# Patient Record
Sex: Male | Born: 1990 | Race: White | Hispanic: No | Marital: Single | Smoking: Never smoker
Health system: Southern US, Community
[De-identification: ages and names within clinical notes are randomized; demographics above are authoritative.]

---

## 2013-02-02 ENCOUNTER — Emergency Department (INDEPENDENT_AMBULATORY_CARE_PROVIDER_SITE_OTHER): Payer: BC Managed Care – PPO

## 2013-02-02 ENCOUNTER — Emergency Department (HOSPITAL_COMMUNITY)
Admission: EM | Admit: 2013-02-02 | Discharge: 2013-02-02 | Disposition: A | Discharge status: Home / Self Care | Payer: BC Managed Care – PPO | Source: Home / Self Care | Attending: Family Medicine | Admitting: Family Medicine

## 2013-02-02 ENCOUNTER — Encounter (HOSPITAL_COMMUNITY): Payer: Self-pay | Admitting: Emergency Medicine

## 2013-02-02 ENCOUNTER — Other Ambulatory Visit (HOSPITAL_COMMUNITY): Payer: BC Managed Care – PPO

## 2013-02-02 DIAGNOSIS — S82899A Other fracture of unspecified lower leg, initial encounter for closed fracture: Secondary | ICD-10-CM

## 2013-02-02 DIAGNOSIS — S82892A Other fracture of left lower leg, initial encounter for closed fracture: Secondary | ICD-10-CM

## 2013-02-02 DIAGNOSIS — Y9343 Activity, gymnastics: Secondary | ICD-10-CM

## 2013-02-02 MED ORDER — HYDROCODONE-ACETAMINOPHEN 5-325 MG PO TABS: 1.0000 | ORAL_TABLET | Freq: Four times a day (QID) | ORAL | Status: AC | PRN

## 2013-02-02 NOTE — Discharge Instructions (Signed)
°  Ankle Fracture °A fracture is a break in the bone. A cast or splint is used to protect and keep your injured bone from moving.  °HOME CARE INSTRUCTIONS  °· Use your crutches as directed. °· To lessen the swelling, keep the injured leg elevated while sitting or lying down. °· Apply ice to the injury for 15-20 minutes, 03-04 times per day while awake for 2 days. Put the ice in a plastic bag and place a thin towel between the bag of ice and your cast. °· If you have a plaster or fiberglass cast: °· Do not try to scratch the skin under the cast using sharp or pointed objects. °· Check the skin around the cast every day. You may put lotion on any red or sore areas. °· Keep your cast dry and clean. °· If you have a plaster splint: °· Wear the splint as directed. °· You may loosen the elastic around the splint if your toes become numb, tingle, or turn cold or blue. °· Do not put pressure on any part of your cast or splint; it may break. Rest your cast only on a pillow the first 24 hours until it is fully hardened. °· Your cast or splint can be protected during bathing with a plastic bag. Do not lower the cast or splint into water. °· Take medications as directed by your caregiver. Only take over-the-counter or prescription medicines for pain, discomfort, or fever as directed by your caregiver. °· Do not drive a vehicle until your caregiver specifically tells you it is safe to do so. °· If your caregiver has given you a follow-up appointment, it is very important to keep that appointment. Not keeping the appointment could result in a chronic or permanent injury, pain, and disability. If there is any problem keeping the appointment, you must call back to this facility for assistance. °SEEK IMMEDIATE MEDICAL CARE IF:  °· Your cast gets damaged or breaks. °· You have continued severe pain or more swelling than you did before the cast was put on. °· Your skin or toenails below the injury turn blue or gray, or feel cold or  numb. °· There is a bad smell or new stains and/or purulent (pus like) drainage coming from under the cast. °If you do not have a window in your cast for observing the wound, a discharge or minor bleeding may show up as a stain on the outside of your cast. Report these findings to your caregiver. °MAKE SURE YOU:  °· Understand these instructions. °· Will watch your condition. °· Will get help right away if you are not doing well or get worse. °Document Released: 01/03/2000 Document Revised: 03/30/2011 Document Reviewed: 08/04/2012 °[employer]® Patient Information ©2014 [employer]. ° ° °

## 2013-02-02 NOTE — ED Notes (Signed)
Pt c/o left ankle inj onset 1630 today while a gymnastics... Reports he did a flip and when he landed he hear a "pop" Ankle twisted outward and foot inward... sxs include: swelling, pain Alert w/no signs of acute distress... Came back to exam room in crutches.

## 2013-02-02 NOTE — ED Provider Notes (Signed)
CSN: [id number]     Arrival date & time 02/02/13  1747 History   None    Chief Complaint  Patient presents with  . Ankle Injury   (Consider location/radiation/quality/duration/timing/severity/associated sxs/prior Treatment) HPI Comments: 23 year old male presents complaining of left ankle injury sustained about 2 hours ago while performing gymnastics. He was doing a back flip when he landed on his ankle and it rolled inward. She felt a pop in the ankle and had almost immediate swelling in the lateral left ankle. he is not able to bear weight on it since this happened. No numbness in the foot. No other injury.  Patient is a 23 y.o. male presenting with lower extremity injury.  Ankle Injury Pertinent negatives include no chest pain, no abdominal pain and no shortness of breath.    History reviewed. No pertinent past medical history. History reviewed. No pertinent past surgical history. No family history on file. History  Substance Use Topics  . Smoking status: Never Smoker   . Smokeless tobacco: Not on file  . Alcohol Use: Yes    Review of Systems  Constitutional: Negative for fever, chills and fatigue.  HENT: Negative for sore throat.   Eyes: Negative for visual disturbance.  Respiratory: Negative for cough and shortness of breath.   Cardiovascular: Negative for chest pain, palpitations and leg swelling.  Gastrointestinal: Negative for nausea, vomiting, abdominal pain, diarrhea and constipation.  Genitourinary: Negative for dysuria, urgency, frequency and hematuria.  Musculoskeletal: Positive for arthralgias and gait problem. Negative for myalgias, neck pain and neck stiffness.  Skin: Negative for rash.  Neurological: Negative for dizziness, weakness and light-headedness.    Allergies  Review of patient's allergies indicates no known allergies.  Home Medications   Current Outpatient Rx  Name  Route  Sig  Dispense  Refill  . FINASTERIDE PO   Oral   Take by mouth.          Marland Kitchen HYDROcodone-acetaminophen (NORCO/VICODIN) 5-325 MG per tablet   Oral   Take 1 tablet by mouth every 6 (six) hours as needed for moderate pain.   30 tablet   0    BP 126/71  Pulse 89  Temp(Src) 98.5 F (36.9 C) (Oral)  Resp 16  SpO2 99% Physical Exam  Nursing note and vitals reviewed. Constitutional: He is oriented to person, place, and time. He appears well-developed and well-nourished. No distress.  HENT:  Head: Normocephalic.  Pulmonary/Chest: Effort normal. No respiratory distress.  Musculoskeletal:       Left ankle: He exhibits decreased range of motion and swelling. Tenderness. Lateral malleolus, AITFL and CF ligament tenderness found. Achilles tendon normal.  Neurological: He is alert and oriented to person, place, and time. Coordination normal.  Skin: Skin is warm and dry. No rash noted. He is not diaphoretic.  Psychiatric: He has a normal mood and affect. Judgment normal.    ED Course  Procedures (including critical care time) Labs Review Labs Reviewed - No data to display Imaging Review Dg Ankle Complete Left  02/02/2013   CLINICAL DATA:  Left ankle injury.  EXAM: LEFT ANKLE COMPLETE - 3+ VIEW  COMPARISON:  Concern none available  FINDINGS: Tiny bony fragment inferior to medial malleolus tip without donor site. No dislocation. The ankle mortise appears congruent and the tibiofibular syndesmosis intact. No destructive bony lesions. Lateral ankle soft tissue swelling without subcutaneous gas or radiopaque foreign bodies.  IMPRESSION: Tiny bony fragment at the medial malleolar tip concerning for avulsion injury. Recommend correlation with point tenderness. No  dislocation.   Electronically Signed   By: Awilda Metroourtnay  Bloomer   On: 02/02/2013 18:38      MDM   1. Avulsion fracture of left ankle    Cam Walker boot and crutches. He will followup with orthopedics. This is either simple avulsion fracture or avulsed ligament of the ankle. He may need an MRI to delineate this  further. Nonweightbearing and RICE until he follows up with orthopedics   Meds ordered this encounter  Medications  . HYDROcodone-acetaminophen (NORCO/VICODIN) 5-325 MG per tablet    Sig: Take 1 tablet by mouth every 6 (six) hours as needed for moderate pain.    Dispense:  30 tablet    Refill:  0    Order Specific Question:  Supervising Provider    Answer:  Bradd CanaryKINDL, JAMES D [[id number]]       Graylon GoodZachary H Donavan Kerlin, PA-C 02/02/13 1904

## 2013-02-02 NOTE — ED Provider Notes (Signed)
Medical screening examination/treatment/procedure(s) were performed by resident physician or non-physician practitioner and as supervising physician I was immediately available for consultation/collaboration.   Jessamyn Watterson DOUGLAS MD.   Marybeth Dandy D Anora Schwenke, MD 02/02/13 2056 

## 2015-07-17 IMAGING — CR DG ANKLE COMPLETE 3+V*L*
3 series · 3 of 3 positions shown · non-contrast
Comparison: Concern none available

CLINICAL DATA: Left ankle injury.

EXAM:
LEFT ANKLE COMPLETE - 3+ VIEW

[view not recorded (1 of 3)]
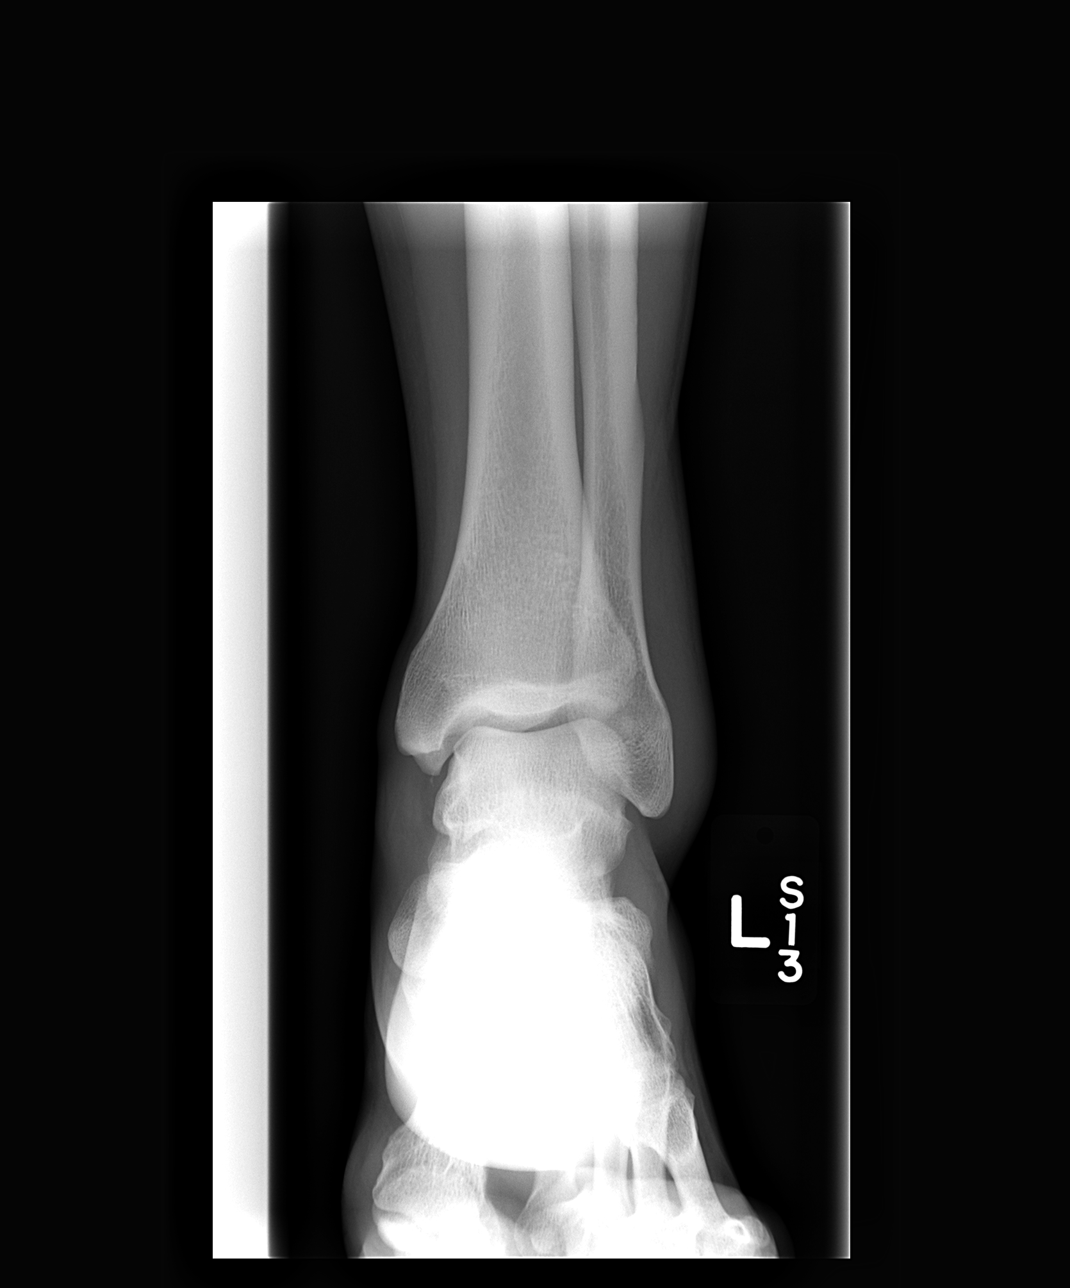

[view not recorded (2 of 3)]
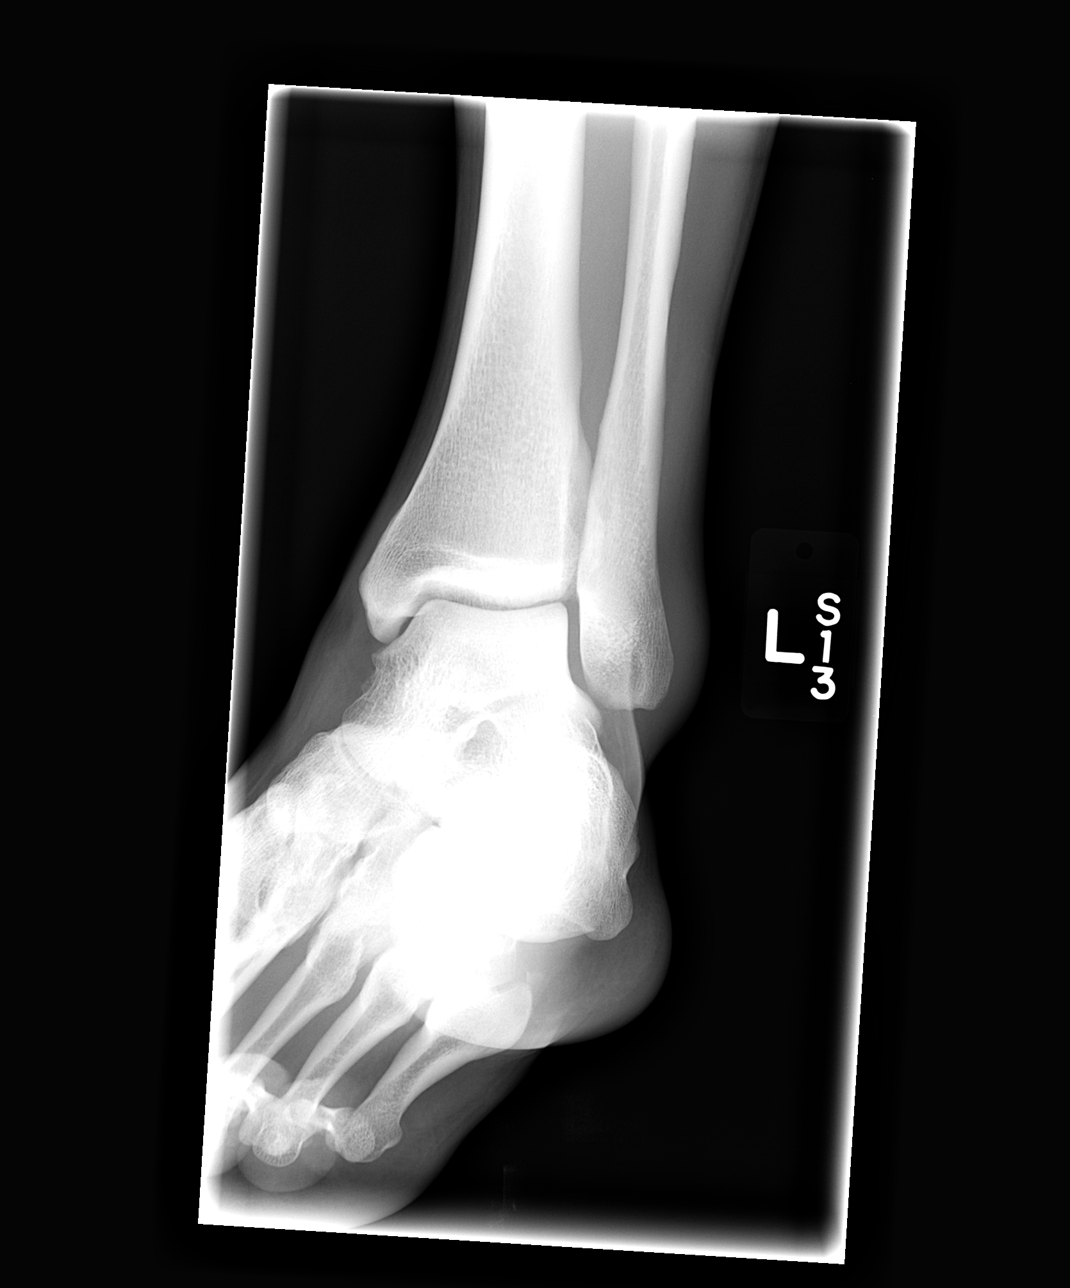

[view not recorded (3 of 3)]
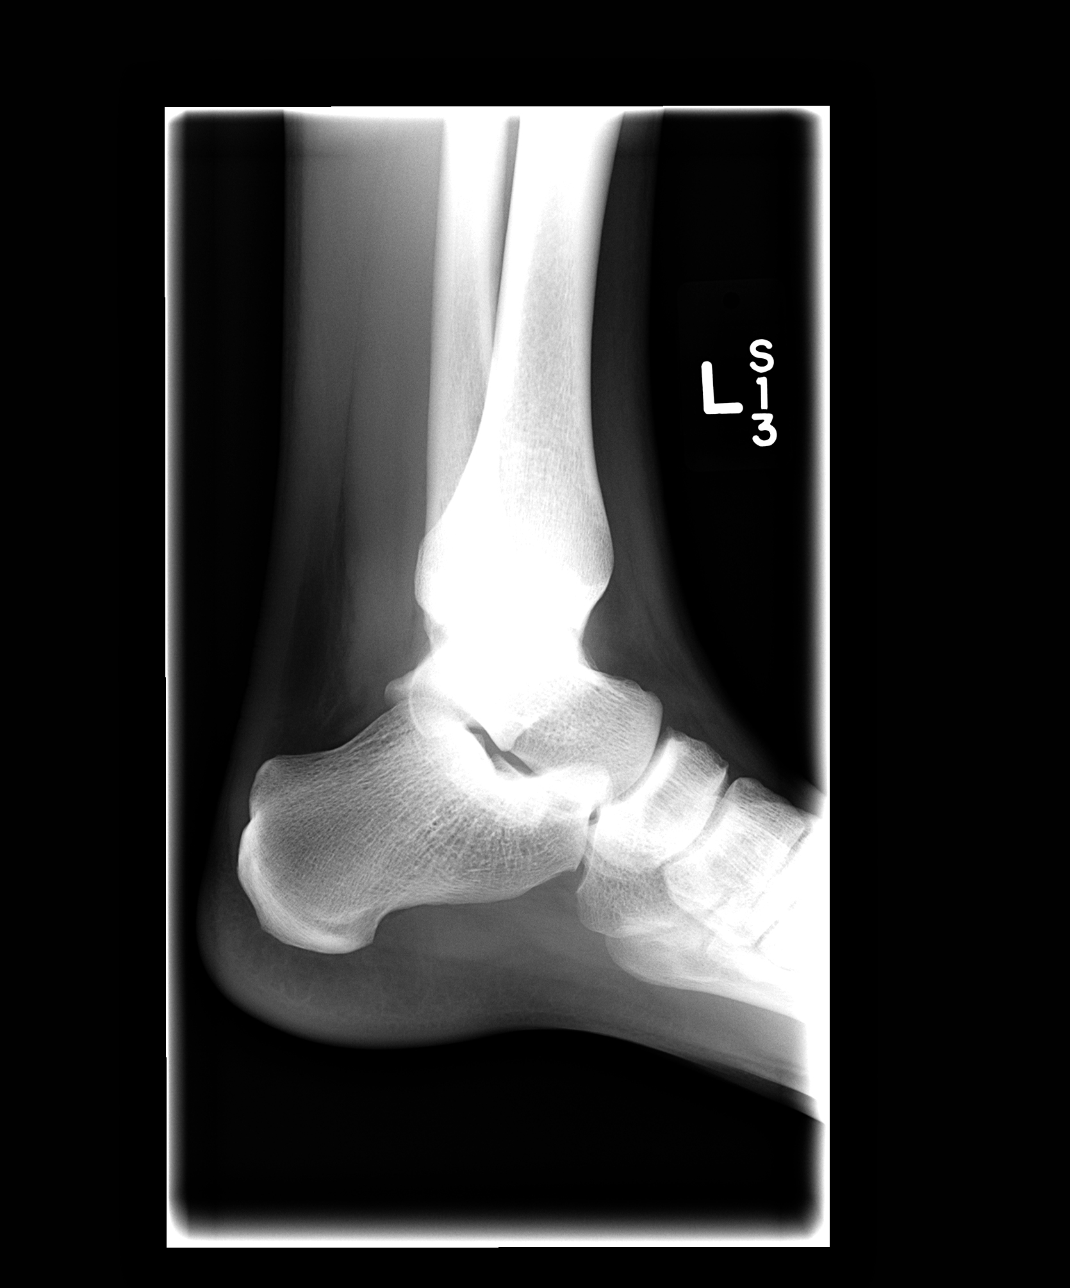

[3 of 3 positions shown; findings below may reference images not displayed]

FINDINGS: Tiny bony fragment inferior to medial malleolus tip without donor
site. No dislocation. The ankle mortise appears congruent and the
tibiofibular syndesmosis intact. No destructive bony lesions.
Lateral ankle soft tissue swelling without subcutaneous gas or
radiopaque foreign bodies.
IMPRESSION: Tiny bony fragment at the medial malleolar tip concerning for
avulsion injury. Recommend correlation with point tenderness. No
dislocation.

  By: Prosonjit Johan
